# Patient Record
Sex: Male | Born: 1995 | Race: Black or African American | Hispanic: No | Marital: Single | Smoking: Never smoker
Health system: Southern US, Community
[De-identification: ages and names within clinical notes are randomized; demographics above are authoritative.]

## PROBLEM LIST (undated history)

## (undated) DIAGNOSIS — R569 Unspecified convulsions: Secondary | ICD-10-CM

---
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
## 2015-09-21 ENCOUNTER — Emergency Department
Admission: EM | Admit: 2015-09-21 | Discharge: 2015-09-21 | Disposition: A | Discharge status: Home / Self Care | Payer: Self-pay | Attending: Emergency Medicine | Admitting: Emergency Medicine

## 2015-09-21 DIAGNOSIS — R569 Unspecified convulsions: Secondary | ICD-10-CM | POA: Insufficient documentation

## 2015-09-21 DIAGNOSIS — X58XXXA Exposure to other specified factors, initial encounter: Secondary | ICD-10-CM | POA: Insufficient documentation

## 2015-09-21 DIAGNOSIS — S0990XA Unspecified injury of head, initial encounter: Secondary | ICD-10-CM | POA: Insufficient documentation

## 2015-09-21 DIAGNOSIS — Y9389 Activity, other specified: Secondary | ICD-10-CM | POA: Insufficient documentation

## 2015-09-21 DIAGNOSIS — Y998 Other external cause status: Secondary | ICD-10-CM | POA: Insufficient documentation

## 2015-09-21 DIAGNOSIS — Y9289 Other specified places as the place of occurrence of the external cause: Secondary | ICD-10-CM | POA: Insufficient documentation

## 2015-09-21 DIAGNOSIS — S00512A Abrasion of oral cavity, initial encounter: Secondary | ICD-10-CM | POA: Insufficient documentation

## 2015-09-21 HISTORY — DX: Unspecified convulsions: R56.9

## 2015-09-21 LAB — CBC WITH DIFFERENTIAL/PLATELET
BASOS PCT: 1 %
Basophils Absolute: 0 10*3/uL (ref 0–0.1)
EOS ABS: 0.1 10*3/uL (ref 0–0.7)
Eosinophils Relative: 1 %
HCT: 46.3 % (ref 40.0–52.0)
HEMOGLOBIN: 16.1 g/dL (ref 13.0–18.0)
Lymphocytes Relative: 22 %
Lymphs Abs: 1.2 10*3/uL (ref 1.0–3.6)
MCH: 30.5 pg (ref 26.0–34.0)
MCHC: 34.7 g/dL (ref 32.0–36.0)
MCV: 87.8 fL (ref 80.0–100.0)
MONOS PCT: 7 %
Monocytes Absolute: 0.3 10*3/uL (ref 0.2–1.0)
NEUTROS PCT: 69 %
Neutro Abs: 3.6 10*3/uL (ref 1.4–6.5)
Platelets: 156 10*3/uL (ref 150–440)
RBC: 5.27 MIL/uL (ref 4.40–5.90)
RDW: 14.1 % (ref 11.5–14.5)
WBC: 5.2 10*3/uL (ref 3.8–10.6)

## 2015-09-21 LAB — COMPREHENSIVE METABOLIC PANEL
ALBUMIN: 4.4 g/dL (ref 3.5–5.0)
ALK PHOS: 83 U/L (ref 38–126)
ALT: 15 U/L — ABNORMAL LOW (ref 17–63)
ANION GAP: 5 (ref 5–15)
AST: 20 U/L (ref 15–41)
BUN: 17 mg/dL (ref 6–20)
CO2: 23 mmol/L (ref 22–32)
Calcium: 9 mg/dL (ref 8.9–10.3)
Chloride: 111 mmol/L (ref 101–111)
Creatinine, Ser: 0.72 mg/dL (ref 0.61–1.24)
GFR calc Af Amer: >60 mL/min (ref >60)
GFR calc non Af Amer: >60 mL/min (ref >60)
GLUCOSE: 98 mg/dL (ref 65–99)
POTASSIUM: 3.9 mmol/L (ref 3.5–5.1)
SODIUM: 139 mmol/L (ref 135–145)
Total Bilirubin: 0.8 mg/dL (ref 0.3–1.2)
Total Protein: 7.3 g/dL (ref 6.5–8.1)

## 2015-09-21 LAB — URINE DRUG SCREEN, QUALITATIVE (ARMC ONLY)
AMPHETAMINES, UR SCREEN: NOT DETECTED
Barbiturates, Ur Screen: NOT DETECTED
Benzodiazepine, Ur Scrn: NOT DETECTED
Cannabinoid 50 Ng, Ur ~~LOC~~: NOT DETECTED
Cocaine Metabolite,Ur ~~LOC~~: NOT DETECTED
MDMA (ECSTASY) UR SCREEN: NOT DETECTED
Methadone Scn, Ur: NOT DETECTED
OPIATE, UR SCREEN: NOT DETECTED
PHENCYCLIDINE (PCP) UR S: NOT DETECTED
Tricyclic, Ur Screen: NOT DETECTED

## 2015-09-21 MED ORDER — IBUPROFEN 800 MG PO TABS
ORAL_TABLET | ORAL | Status: AC
  Administered 2015-09-21: 800 mg via ORAL
  Filled 2015-09-21: qty 1

## 2015-09-21 MED ORDER — IBUPROFEN 800 MG PO TABS
800.0000 mg | ORAL_TABLET | Freq: Once | ORAL | Status: AC
  Administered 2015-09-21: 800 mg via ORAL

## 2015-09-21 MED ORDER — LEVETIRACETAM 500 MG PO TABS: 500.0000 mg | ORAL_TABLET | Freq: Once | ORAL | Status: AC

## 2015-09-21 NOTE — ED Provider Notes (Signed)
[facility] Emergency Department Provider Note     Time seen: ----------------------------------------- 7:51 AM on 09/21/2015 -----------------------------------------    I have reviewed the triage vital signs and the nursing notes.   HISTORY  Chief Complaint Seizures    HPI Peter Stokes. is a 20 y.o. male brought to the ER after suspected seizure this morning. Patient is brought in with his mother, states she was woken by some noise in his room. She states he has not been acting himself, is his history of seizures and is on Keppra. Mom states he woke her up 30 minutes prior to arrival concerning have had a seizure complaint of headache and soreness to the left side of his tongue. The symptoms are consistent with prior seizure. Patient denies any complaints currently other than headache. They report patient has to get up very early in the morning to go to work, he's been working almost every day.   Past Medical History  Diagnosis Date  . Seizures (HCC)     There are no active problems to display for this patient.   History reviewed. No pertinent past surgical history.  Allergies Review of patient's allergies indicates no known allergies.  Social History Social History  Substance Use Topics  . Smoking status: Never Smoker   . Smokeless tobacco: None  . Alcohol Use: No    Review of Systems Constitutional: Negative for fever. Eyes: Negative for visual changes. ENT: Negative for sore throat. Cardiovascular: Negative for chest pain. Respiratory: Negative for shortness of breath. Gastrointestinal: Negative for abdominal pain, vomiting and diarrhea. Genitourinary: Negative for dysuria. Musculoskeletal: Negative for back pain. Skin: Negative for rash. Neurological: Positive for headache, possible seizure  10-point ROS otherwise negative.  ____________________________________________   PHYSICAL EXAM:  VITAL SIGNS: ED Triage Vitals   Enc Vitals Group     BP 09/21/15 0717 129/83 mmHg     Pulse Rate 09/21/15 0717 61     Resp 09/21/15 0717 18     Temp 09/21/15 0717 97.9 F (36.6 C)     Temp Source 09/21/15 0717 Oral     SpO2 09/21/15 0717 100 %     Weight 09/21/15 0717 150 lb (68.04 kg)     Height 09/21/15 0717  (1.702 m)     Head Cir --      Peak Flow --      Pain Score 09/21/15 0718 8     Pain Loc --      Pain Edu? --      Excl. in GC? --     Constitutional: Alert and oriented. Well appearing and in no distress. Eyes: Conjunctivae are normal. PERRL. Normal extraocular movements. ENT   Head: Normocephalic and atraumatic.   Nose: No congestion/rhinnorhea.   Mouth/Throat: Mucous membranes are moist. Abrasions noted to the left side of the tongue   Neck: No stridor. Cardiovascular: Normal rate, regular rhythm. Normal and symmetric distal pulses are present in all extremities. No murmurs, rubs, or gallops. Respiratory: Normal respiratory effort without tachypnea nor retractions. Breath sounds are clear and equal bilaterally. No wheezes/rales/rhonchi. Gastrointestinal: Soft and nontender. No distention. No abdominal bruits.  Musculoskeletal: Nontender with normal range of motion in all extremities. No joint effusions.  No lower extremity tenderness nor edema. Neurologic:  Normal speech and language. No gross focal neurologic deficits are appreciated. Speech is normal. No gait instability. Skin:  Skin is warm, dry and intact. No rash noted. Psychiatric: Mood and affect are normal. Speech and behavior  are normal. Patient exhibits appropriate insight and judgment. ____________________________________________  ED COURSE:  Pertinent labs & imaging results that were available during my care of the patient were reviewed by me and considered in my medical decision making (see chart for details). Patient is in no acute distress, likely breakthrough seizure. We'll obtain basic labs, likely increase his  Keppra dose. ____________________________________________    LABS (pertinent positives/negatives)  Labs Reviewed  COMPREHENSIVE METABOLIC PANEL - Abnormal; Notable for the following:    ALT 15 (*)    All other components within normal limits  CBC WITH DIFFERENTIAL/PLATELET  URINE DRUG SCREEN, QUALITATIVE ([facility] ONLY)   ____________________________________________  FINAL ASSESSMENT AND PLAN  Seizure  Plan: Patient with labs as dictated above. Patient is in no acute distress, is ambulatory and at his baseline neurologically. I will increase his Keppra from 2000 mg a day to 2500 mg. He'll take thousand milligrams in the morning and 1500 mg at night.   Emily Filbert, MD   Emily Filbert, MD 09/21/15 [phone]

## 2015-09-21 NOTE — Discharge Instructions (Signed)

## 2015-09-21 NOTE — ED Notes (Signed)
Pt here with his mother, she states he woke her up pta with c/o HA with possible seizure, states he has lac to tongue.. States he has not been acting himself.. States he has a hx of seizures and is on medication for them.

## 2016-04-11 ENCOUNTER — Emergency Department
Admission: EM | Admit: 2016-04-11 | Discharge: 2016-04-11 | Disposition: A | Discharge status: Home / Self Care | Payer: Self-pay | Attending: Emergency Medicine | Admitting: Emergency Medicine

## 2016-04-11 ENCOUNTER — Encounter: Payer: Self-pay | Admitting: Emergency Medicine

## 2016-04-11 DIAGNOSIS — L02411 Cutaneous abscess of right axilla: Secondary | ICD-10-CM | POA: Insufficient documentation

## 2016-04-11 DIAGNOSIS — L0291 Cutaneous abscess, unspecified: Secondary | ICD-10-CM

## 2016-04-11 MED ORDER — IBUPROFEN 800 MG PO TABS: 800.0000 mg | ORAL_TABLET | Freq: Three times a day (TID) | ORAL | 0 refills | Status: AC | PRN

## 2016-04-11 MED ORDER — LIDOCAINE-EPINEPHRINE (PF) 1 %-1:200000 IJ SOLN: 30.0000 mL | Freq: Once | INTRAMUSCULAR | Status: DC

## 2016-04-11 MED ORDER — LIDOCAINE-EPINEPHRINE (PF) 1 %-1:200000 IJ SOLN
INTRAMUSCULAR | Status: AC
  Administered 2016-04-11: 30 mL
  Filled 2016-04-11: qty 30

## 2016-04-11 MED ORDER — SULFAMETHOXAZOLE-TRIMETHOPRIM 800-160 MG PO TABS: 1.0000 | ORAL_TABLET | Freq: Two times a day (BID) | ORAL | 0 refills | Status: DC

## 2016-04-11 MED ORDER — HYDROCODONE-ACETAMINOPHEN 5-325 MG PO TABS
1.0000 | ORAL_TABLET | ORAL | Status: AC
  Administered 2016-04-11: 1 via ORAL

## 2016-04-11 NOTE — ED Provider Notes (Signed)
[facility]-EMERGENCY DEPARTMENT Provider Note   CSN: [id number] Arrival date & time: 04/11/16  1736     History   Chief Complaint Chief Complaint  Patient presents with  . Abscess    HPI Peter Stokes. is a 20 y.o. male presents to the emergency for evaluation of right axillary abscess. Abscess has been present for 4 days. No history of abscesses to the axillary regions. Pain is 10 out of 10. Is not a medications for pain. He denies any drainage or fevers. No numbness or tingling in the right upper extremity. No headaches or neck pain.  HPI  Past Medical History:  Diagnosis Date  . Seizures (HCC)     There are no active problems to display for this patient.   No past surgical history on file.     Home Medications    Prior to Admission medications   Medication Sig Start Date End Date Taking? Authorizing Provider  ibuprofen (ADVIL,MOTRIN) 800 MG tablet Take 1 tablet (800 mg total) by mouth every 8 (eight) hours as needed. 04/11/16   Evon Slack, PA-C  levETIRAcetam (KEPPRA) 500 MG tablet Take 1 tablet (500 mg total) by mouth once. Take one tablet with your 1000 mg tablets at night for seizure prevention.   You will be taking 1000 mg in the morning and 1500 mg at night 09/21/15   Emily Filbert, MD  sulfamethoxazole-trimethoprim (BACTRIM DS,SEPTRA DS) 800-160 MG tablet Take 1 tablet by mouth 2 (two) times daily. 04/11/16   Evon Slack, PA-C    Family History No family history on file.  Social History Social History  Substance Use Topics  . Smoking status: Never Smoker  . Smokeless tobacco: Not on file  . Alcohol use No     Allergies   Review of patient's allergies indicates no known allergies.   Review of Systems Review of Systems  Constitutional: Negative for chills and fever.  HENT: Negative for ear pain and sore throat.   Eyes: Negative for pain and visual disturbance.  Respiratory: Negative for cough and shortness of breath.     Cardiovascular: Negative for chest pain and palpitations.  Gastrointestinal: Negative for abdominal pain and vomiting.  Genitourinary: Negative for dysuria and hematuria.  Musculoskeletal: Negative for arthralgias and back pain.  Skin: Positive for wound (Right axillary abscess). Negative for color change and rash.  Neurological: Negative for seizures and syncope.  All other systems reviewed and are negative.    Physical Exam Updated Vital Signs BP (!) 142/85 (BP Location: Left Arm)   Pulse 74   Temp 98.1 F (36.7 C) (Oral)   Resp 18   Ht 5\' 4"  (1.626 m)   Wt 68 kg   SpO2 100%   BMI 25.75 kg/m   Physical Exam  Constitutional: He appears well-developed and well-nourished.  HENT:  Head: Normocephalic and atraumatic.  Eyes: Conjunctivae are normal.  Neck: Neck supple.  Cardiovascular: Normal rate and regular rhythm.   No murmur heard. Pulmonary/Chest: Effort normal and breath sounds normal. No respiratory distress.  Abdominal: Soft. There is no tenderness.  Musculoskeletal: Normal range of motion. He exhibits no edema.  Neurological: He is alert.  Skin: Skin is warm and dry.  Right axillary abscess 3 cm in diameter, fluctuant and tender to palpation. No active drainage. Patient will full range of motion right upper extremity.  Psychiatric: He has a normal mood and affect.  Nursing note and vitals reviewed.    ED Treatments / Results  Labs (  all labs ordered are listed, but only abnormal results are displayed) Labs Reviewed - No data to display  EKG  EKG Interpretation None       Radiology No results found.  Procedures Procedures (including critical care time) INCISION AND DRAINAGE Performed by: Patience MuscaGAINES, Woodward Klem CHRISTOPHER Consent: Verbal consent obtained. Risks and benefits: risks, benefits and alternatives were discussed Type: abscess  Body area: Right axillary  Anesthesia: local infiltration  Incision was made with a scalpel.  Local anesthetic:  lidocaine 1 % with epinephrine  Anesthetic total: 3 ml  Complexity: complex Blunt dissection to break up loculations  Drainage: purulent  Drainage amount: 5 cc   Packing material: 1/4 in iodoform gauze  Patient tolerance: Patient tolerated the procedure well with no immediate complications.    Medications Ordered in ED Medications  lidocaine-EPINEPHrine (XYLOCAINE W/EPI) 1 %-1:100000 (with pres) injection 30 mL (not administered)  HYDROcodone-acetaminophen (NORCO/VICODIN) 5-325 MG per tablet 1 tablet (1 tablet Oral Given 04/11/16 1836)  lidocaine-EPINEPHrine (XYLOCAINE-EPINEPHrine) 1 %-1:200000 (PF) injection (30 mLs  Given 04/11/16 1837)     Initial Impression / Assessment and Plan / ED Course  I have reviewed the triage vital signs and the nursing notes.  Pertinent labs & imaging results that were available during my care of the patient were reviewed by me and considered in my medical decision making (see chart for details).  Clinical Course    20 year old male with right axillary abscess. Incision and drainage was performed. Abscess was packed with iodoform packing. He is placed on Bactrim DS. He'll follow-up with PCP or emergency pertinent to 3 days for wound check and packing removal. He is educated on signs and symptoms return or to pertinent for.  Final Clinical Impressions(s) / ED Diagnoses   Final diagnoses:  Abscess  Abscess of axilla, right    New Prescriptions New Prescriptions   IBUPROFEN (ADVIL,MOTRIN) 800 MG TABLET    Take 1 tablet (800 mg total) by mouth every 8 (eight) hours as needed.   SULFAMETHOXAZOLE-TRIMETHOPRIM (BACTRIM DS,SEPTRA DS) 800-160 MG TABLET    Take 1 tablet by mouth 2 (two) times daily.     Evon Slackhomas C Shariyah Eland, PA-C 04/11/16 1907    Arnaldo NatalPaul F Malinda, MD 04/20/16 1311

## 2016-04-11 NOTE — ED Notes (Signed)
States he developed a possible abscess area under right arm about 3-4 days ago   Area has gotten larger and increased pain

## 2016-04-11 NOTE — ED Notes (Signed)
Reviewed d/c instructions, follow-up care and prescriptions with pt. Pt verbalized understanding 

## 2016-04-11 NOTE — Discharge Instructions (Signed)
Please follow-up with PCP or emergency department to 3 days for wound check, packing removal. Return to the ER medially for any fevers severe pain worsening symptoms urgent changes in her health.

## 2016-04-11 NOTE — ED Triage Notes (Signed)
Pt with abscess to right armpit x4 days.

## 2016-04-13 ENCOUNTER — Encounter: Payer: Self-pay | Admitting: Emergency Medicine

## 2016-04-13 ENCOUNTER — Emergency Department
Admission: EM | Admit: 2016-04-13 | Discharge: 2016-04-13 | Disposition: A | Discharge status: Home / Self Care | Payer: Self-pay | Attending: Emergency Medicine | Admitting: Emergency Medicine

## 2016-04-13 DIAGNOSIS — Z4801 Encounter for change or removal of surgical wound dressing: Secondary | ICD-10-CM | POA: Insufficient documentation

## 2016-04-13 DIAGNOSIS — Z09 Encounter for follow-up examination after completed treatment for conditions other than malignant neoplasm: Secondary | ICD-10-CM

## 2016-04-13 NOTE — ED Triage Notes (Signed)
Wound check for abscess in right axilla

## 2016-04-13 NOTE — Discharge Instructions (Signed)
Finish all antibiotics as previously prescribed.   Keep wound covered until fully closed.

## 2016-04-13 NOTE — ED Provider Notes (Signed)
[facility] Emergency Department Provider Note  ____________________________________________  Time seen: Approximately 10:52 AM  I have reviewed the triage vital signs and the nursing notes.   HISTORY  Chief Complaint Wound Check    HPI Peter Stokes. is a 20 y.o. male , NAD, presents to the emergency department accompanied by his mother, for recheck of right axilla after incision and drainage. Patient was seen in this emergency department 2 days ago for incision and drainage of abscess about the right axilla. Packing was placed and gauze covering. Patient states he has been tolerating his medications without side effects or difficulty. Denies any fevers, chills, body aches. Has had full range of motion of the right upper extremity without pain or difficulty. Has numbness, weakness, tingling. Has had no chest pain, shortness breath, abdominal pain, nausea, vomiting, diarrhea. Has not noted any excessive drainage from the area since the incision and drainage 2 days ago. Has no other complaints to be evaluated today.   Past Medical History:  Diagnosis Date  . Seizures (HCC)     There are no active problems to display for this patient.   History reviewed. No pertinent surgical history.  Prior to Admission medications   Medication Sig Start Date End Date Taking? Authorizing Provider  ibuprofen (ADVIL,MOTRIN) 800 MG tablet Take 1 tablet (800 mg total) by mouth every 8 (eight) hours as needed. 04/11/16   Evon Slack, PA-C  levETIRAcetam (KEPPRA) 500 MG tablet Take 1 tablet (500 mg total) by mouth once. Take one tablet with your 1000 mg tablets at night for seizure prevention.   You will be taking 1000 mg in the morning and 1500 mg at night 09/21/15   Emily Filbert, MD  sulfamethoxazole-trimethoprim (BACTRIM DS,SEPTRA DS) 800-160 MG tablet Take 1 tablet by mouth 2 (two) times daily. 04/11/16   Evon Slack, PA-C    Allergies Review of patient's  allergies indicates no known allergies.  No family history on file.  Social History Social History  Substance Use Topics  . Smoking status: Never Smoker  . Smokeless tobacco: Never Used  . Alcohol use No     Review of Systems  Constitutional: No fever/chills Cardiovascular: No chest pain. Respiratory: No shortness of breath. No wheezing.  Gastrointestinal: No abdominal pain.  No nausea, vomiting.  No diarrhea. Musculoskeletal: Negative for General myalgias or right upper extremity pain.  Skin: Positive incisional wound right axilla. Negative for rash, redness, swelling. Neurological: Negative for numbness, weakness, tingling.Marland Kitchen 10-point ROS otherwise negative.  ____________________________________________   PHYSICAL EXAM:  VITAL SIGNS: ED Triage Vitals  Enc Vitals Group     BP 04/13/16 1018 137/71     Pulse Rate 04/13/16 1018 89     Resp 04/13/16 1018 18     Temp 04/13/16 1018 97.9 F (36.6 C)     Temp Source 04/13/16 1018 Oral     SpO2 04/13/16 1018 98 %     Weight 04/13/16 1019 150 lb (68 kg)     Height 04/13/16 1019 5\' 6"  (1.676 m)     Head Circumference --      Peak Flow --      Pain Score --      Pain Loc --      Pain Edu? --      Excl. in GC? --      Constitutional: Alert and oriented. Well appearing and in no acute distress. Eyes: Conjunctivae are normal. Head: Atraumatic. Hematological/Lymphatic/Immunilogical: No Axillary lymphadenopathy. Cardiovascular:  Good peripheral circulation. Respiratory: Normal respiratory effort without tachypnea or retractions.  Musculoskeletal: Full range of motion of the right upper extremity without difficulty or pain. Neurologic:  Normal speech and language. No gross focal neurologic deficits are appreciated.  Skin:  Incisional wound approximately 0.5cm noted about the right axilla with packing in place. Packing was removed without difficulty or pain. No active oozing or weeping. Area is without induration and has trace  tenderness to deep palpation. Wound and cavity are clean with good granulation tissue. Skin is warm, dry. No rash noted. Psychiatric: Mood and affect are normal. Speech and behavior are normal. Patient exhibits appropriate insight and judgement.   ____________________________________________   LABS  None ____________________________________________  EKG  None ____________________________________________  RADIOLOGY  None ____________________________________________    PROCEDURES  Procedure(s) performed: None   Procedures   Medications - No data to display   ____________________________________________   INITIAL IMPRESSION / ASSESSMENT AND PLAN / ED COURSE  Pertinent labs & imaging results that were available during my care of the patient were reviewed by me and considered in my medical decision making (see chart for details).  Clinical Course    Patient's diagnosis is consistent with Encounter for recheck of abscess following incision and drainage. Patient will be discharged home with instructions to continue all antibiotics until completion. Patient is to keep the wound clean and dry until fully healed and closed then may continue with regular cleansing routine. [facility]. Patient is given ED precautions to return to the ED for any worsening or new symptoms.    ____________________________________________  FINAL CLINICAL IMPRESSION(S) / ED DIAGNOSES  Final diagnoses:  Encounter for recheck of abscess following incision and drainage      NEW MEDICATIONS STARTED DURING THIS VISIT:  New Prescriptions   No medications on file         Hope PigeonJami L Shelita Steptoe, PA-C 04/13/16 1104    Jeanmarie PlantJames A McShane, MD 04/13/16 1459

## 2016-09-11 ENCOUNTER — Emergency Department: Payer: Self-pay

## 2016-09-11 ENCOUNTER — Emergency Department
Admission: EM | Admit: 2016-09-11 | Discharge: 2016-09-11 | Disposition: A | Discharge status: Home / Self Care | Payer: Self-pay | Attending: Emergency Medicine | Admitting: Emergency Medicine

## 2016-09-11 ENCOUNTER — Encounter: Payer: Self-pay | Admitting: Emergency Medicine

## 2016-09-11 DIAGNOSIS — R002 Palpitations: Secondary | ICD-10-CM | POA: Insufficient documentation

## 2016-09-11 DIAGNOSIS — R519 Headache, unspecified: Secondary | ICD-10-CM

## 2016-09-11 DIAGNOSIS — G40909 Epilepsy, unspecified, not intractable, without status epilepticus: Secondary | ICD-10-CM | POA: Insufficient documentation

## 2016-09-11 DIAGNOSIS — R51 Headache: Secondary | ICD-10-CM

## 2016-09-11 LAB — CBC WITH DIFFERENTIAL/PLATELET
BASOS ABS: 0 10*3/uL (ref 0–0.1)
BASOS PCT: 1 %
Eosinophils Absolute: 0.1 10*3/uL (ref 0–0.7)
Eosinophils Relative: 1 %
HEMATOCRIT: 50.5 % (ref 40.0–52.0)
HEMOGLOBIN: 17 g/dL (ref 13.0–18.0)
Lymphocytes Relative: 27 %
Lymphs Abs: 1.6 10*3/uL (ref 1.0–3.6)
MCH: 30.3 pg (ref 26.0–34.0)
MCHC: 33.7 g/dL (ref 32.0–36.0)
MCV: 89.8 fL (ref 80.0–100.0)
MONOS PCT: 8 %
Monocytes Absolute: 0.5 10*3/uL (ref 0.2–1.0)
NEUTROS ABS: 3.8 10*3/uL (ref 1.4–6.5)
NEUTROS PCT: 63 %
Platelets: 154 10*3/uL (ref 150–440)
RBC: 5.62 MIL/uL (ref 4.40–5.90)
RDW: 13.8 % (ref 11.5–14.5)
WBC: 5.9 10*3/uL (ref 3.8–10.6)

## 2016-09-11 LAB — COMPREHENSIVE METABOLIC PANEL
ALBUMIN: 4.6 g/dL (ref 3.5–5.0)
ALT: 15 U/L — ABNORMAL LOW (ref 17–63)
ANION GAP: 8 (ref 5–15)
AST: 22 U/L (ref 15–41)
Alkaline Phosphatase: 68 U/L (ref 38–126)
BILIRUBIN TOTAL: 0.8 mg/dL (ref 0.3–1.2)
BUN: 17 mg/dL (ref 6–20)
CHLORIDE: 105 mmol/L (ref 101–111)
CO2: 24 mmol/L (ref 22–32)
Calcium: 9.5 mg/dL (ref 8.9–10.3)
Creatinine, Ser: 0.92 mg/dL (ref 0.61–1.24)
GFR calc Af Amer: >60 mL/min (ref >60)
GFR calc non Af Amer: >60 mL/min (ref >60)
GLUCOSE: 97 mg/dL (ref 65–99)
POTASSIUM: 3.8 mmol/L (ref 3.5–5.1)
Sodium: 137 mmol/L (ref 135–145)
TOTAL PROTEIN: 7.7 g/dL (ref 6.5–8.1)

## 2016-09-11 LAB — TROPONIN I

## 2016-09-11 NOTE — ED Triage Notes (Signed)
Pt presents with headache and feels like he might have a seizure. Pt with hx of same and takes seizure meds.

## 2016-09-11 NOTE — Discharge Instructions (Signed)
[facility] Please make she continue taking her Keppra. Please call Dr. ENT the cardiologist on call and set up a follow-up appointment to evaluate your palpitations that she were having. At the present time everything we've done and looks normal. Please be sure to return for any further problems.

## 2016-09-11 NOTE — ED Notes (Signed)
Pt reports to EDP that his heart "was beating real fast" this morning and that he felt numb in his left side. He reports that his left leg feels weak. No deficits noted in NIH scale. Pt reported that his father passed away at 4648 with heart issues (a-fib).

## 2016-09-11 NOTE — ED Provider Notes (Signed)
[facility] Emergency Department Provider Note   ____________________________________________   First MD Initiated Contact with Patient 09/11/16 [phone]     (approximate)  I have reviewed the triage vital signs and the nursing notes.   HISTORY  Chief Complaint Seizures and Headache   HPI Lysbeth Pennerdam D Szafranski Jr. is a 21 y.o. male Patient repo it got worse at got worse and worse over the course of the morning to frontal and occipital squeezing and tight bu also says it throbs reports he had some chest pain last night and his heart was beating very fast and short of breath when the heart was beating fast he thinks it lasted less than 5 minutes.Patient says his father and one of his other male relatives died in their late 5040s of heart trouble w Atrial fibrillation.patient reports his left leg feels a little weak. He says he always gets weak in his left leg and on his left side after she had a seizure. He's had this happen several times in the past. He sees [facility]> Patient also   Past Medical History:  Diagnosis Date  . Seizures (HCC)     There are no active problems to display for this patient.   History reviewed. No pertinent surgical history.  Prior to Admission medications   Medication Sig Start Date End Date Taking? Authorizing Provider  ibuprofen (ADVIL,MOTRIN) 800 MG tablet Take 1 tablet (800 mg total) by mouth every 8 (eight) hours as needed. 04/11/16  Yes Evon Slackhomas C Gaines, PA-C  levETIRAcetam (KEPPRA) 500 MG tablet Take 1 tablet (500 mg total) by mouth once. Take one tablet with your 1000 mg tablets at night for seizure prevention.   You will be taking 1000 mg in the morning and 1500 mg at night Patient taking differently: Take 500 mg by mouth once. Pt takes 1000 mg at 7am and 7pm. Pt also take an additional 500 mg at 7pm (1500 mg) 09/21/15  Yes Emily FilbertJonathan E Williams, MD    Allergies Patient has no known allergies.  No family history on file.  Social  History Social History  Substance Use Topics  . Smoking status: Never Smoker  . Smokeless tobacco: Never Used  . Alcohol use No    Review of Systems Constitutional: No fever/chills Eyes: No visual changes. ENT: No sore throat. Cardiovascular: Denies chest pain. Respiratory: Denies shortness of breath. Gastrointestinal: No abdominal pain.  No nausea, no vomiting.  No diarrhea.  No constipation. Genitourinary: Negative for dysuria. Musculoskeletal: Negative for back pain. Skin: Negative for rash. Neurological:see history of present illness 10-point ROS otherwise negative.  ____________________________________________   PHYSICAL EXAM:  VITAL SIGNS: ED Triage Vitals  Enc Vitals Group     BP 09/11/16 0746 131/85     Pulse Rate 09/11/16 0746 68     Resp 09/11/16 0746 18     Temp 09/11/16 0746 98 F (36.7 C)     Temp Source 09/11/16 0746 Oral     SpO2 09/11/16 0746 99 %     Weight 09/11/16 0747 165 lb (74.8 kg)     Height 09/11/16 0747 5\' 8"  (1.727 m)     Head Circumference --      Peak Flow --      Pain Score 09/11/16 0747 8     Pain Loc --      Pain Edu? --      Excl. in GC? --     Constitutional: Alert and oriented. Well appearing and in no acute  distress. Eyes: Conjunctivae are normal. PERRL. EOMI.unduscopic exam is normal Head: Atraumatic. Nose: No congestion/rhinnorhea. Mouth/Throat: Mucous membranes are moist.  Oropharynx non-erythematous. Neck: No stridor.  Cardiovascular: Normal rate, regular rhythm. Grossly normal heart sounds.  Good peripheral circulation. Respiratory: Normal respiratory effort.  No retractions. Lungs CTAB. Gastrointestinal: Soft and nontender. No distention. No abdominal bruits. No CVA tenderness. Musculoskeletal: No lower extremity tenderness nor edema.  No joint effusions. Neurologic:  Normal speech and language. No gross focal neurologic deficits are appreciated.cranial nerves II through XII are intact including visual fields cerebellar  finger-nose is normal motor strength appears to be 5 over 5 throughout. Patient does say his left leg feels somewhat weaker and not sure that I can elicit that. No gait instability. Skin:  Skin is warm, dry and intact. No rash noted. Psychiatric: Mood and affect are normal. Speech and behavior are normal.  ____________________________________________   LABS (all labs ordered are listed, but only abnormal results are displayed)  Labs Reviewed  COMPREHENSIVE METABOLIC PANEL - Abnormal; Notable for the following:       Result Value   ALT 15 (*)    All other components within normal limits  TROPONIN I  CBC WITH DIFFERENTIAL/PLATELET  LEVETIRACETAM LEVEL   ____________________________________________  EKG  EKG read and interpreted by me shows normal sinus rhythm rate of 66 normal axis essentially normal EKG ____________________________________________  RADIOLOGY  Study Result   CLINICAL DATA:  21 year old male with left-sided chest pain and numbness with headache. Nonsmoker. Initial encounter.  EXAM: PORTABLE CHEST 1 VIEW  COMPARISON:  None.  FINDINGS: No infiltrate, congestive heart failure or pneumothorax.  Mild scoliosis thoracic spine.  Heart size within normal limits.  IMPRESSION: No active disease.   Electronically Signed   By: Lacy Duverney M.D.   On: 09/11/2016 08:36   Study Result   CLINICAL DATA:  Headaches for several hours  EXAM: CT HEAD WITHOUT CONTRAST  TECHNIQUE: Contiguous axial images were obtained from the base of the skull through the vertex without intravenous contrast.  COMPARISON:  None.  FINDINGS: Brain: No evidence of acute infarction, hemorrhage, hydrocephalus, extra-axial collection or mass lesion/mass effect.  Vascular: No hyperdense vessel or unexpected calcification.  Skull: Normal. Negative for fracture or focal lesion.  Sinuses/Orbits: No acute finding.  Other: None.  IMPRESSION: No acute  abnormality noted   Electronically Signed   By: Alcide Clever M.D.   On: 09/11/2016 10:30     ____________________________________________   PROCEDURES  Procedure(s) performed:  Procedures  Critical Care performed:  ____________________________________________   INITIAL IMPRESSION / ASSESSMENT AND PLAN / ED COURSE  Pertinent labs & imaging results that were available during my care of the patient were reviewed by me and considered in my medical decision making (see chart for details).        ____________________________________________   FINAL CLINICAL IMPRESSION(S) / ED DIAGNOSES  Final diagnoses:  Nonintractable episodic headache, unspecified headache type  Seizure disorder (HCC)  Palpitations      NEW MEDICATIONS STARTED DURING THIS VISIT:  New Prescriptions   No medications on file     Note:  This document was prepared using Dragon voice recognition software and may include unintentional dictation errors.    Arnaldo Natal, MD 09/11/16 1113

## 2016-09-11 NOTE — ED Notes (Signed)
Pt presents with headache since 4am; states he went back to sleep and awakened with it worse. Pt also reports chest pain last night that was still present when he went to sleep. It has since resolved. Pt states pain is in the front and back of his head, squeezing in nature.

## 2016-09-13 LAB — LEVETIRACETAM LEVEL: LEVETIRACETAM: 28.2 ug/mL (ref 10.0–40.0)

## 2016-11-15 ENCOUNTER — Emergency Department
Admission: EM | Admit: 2016-11-15 | Discharge: 2016-11-15 | Disposition: A | Discharge status: Home / Self Care | Payer: Self-pay | Attending: Emergency Medicine | Admitting: Emergency Medicine

## 2016-11-15 DIAGNOSIS — Z79899 Other long term (current) drug therapy: Secondary | ICD-10-CM | POA: Insufficient documentation

## 2016-11-15 DIAGNOSIS — K0889 Other specified disorders of teeth and supporting structures: Secondary | ICD-10-CM

## 2016-11-15 DIAGNOSIS — K0510 Chronic gingivitis, plaque induced: Secondary | ICD-10-CM | POA: Insufficient documentation

## 2016-11-15 DIAGNOSIS — Z791 Long term (current) use of non-steroidal anti-inflammatories (NSAID): Secondary | ICD-10-CM | POA: Insufficient documentation

## 2016-11-15 MED ORDER — PENICILLIN V POTASSIUM 500 MG PO TABS: 500.0000 mg | ORAL_TABLET | Freq: Three times a day (TID) | ORAL | 0 refills | Status: AC

## 2016-11-15 MED ORDER — PENICILLIN V POTASSIUM 500 MG PO TABS
500.0000 mg | ORAL_TABLET | Freq: Once | ORAL | Status: AC
  Administered 2016-11-15: 500 mg via ORAL
  Filled 2016-11-15: qty 1

## 2016-11-15 NOTE — ED Provider Notes (Signed)
[facility] Emergency Department Provider Note ____________________________________________  Time seen: 1000  I have reviewed the triage vital signs and the nursing notes.  HISTORY  Chief Complaint  Dental Pain   HPI Peter Stokes. is a 21 y.o. male presents to the ED for evaluation of left upper tooth pain for the last 2 days. He denies any fevers, chills, or sweats. He is unsure of what tooth, exactly is causing his pain. He denies any gum or facial swelling, pointing abscess, or purulent drainage.   Past Medical History:  Diagnosis Date  . Seizures (HCC)     There are no active problems to display for this patient.   History reviewed. No pertinent surgical history.  Prior to Admission medications   Medication Sig Start Date End Date Taking? Authorizing Provider  ibuprofen (ADVIL,MOTRIN) 800 MG tablet Take 1 tablet (800 mg total) by mouth every 8 (eight) hours as needed. 04/11/16   Evon Slack, PA-C  levETIRAcetam (KEPPRA) 500 MG tablet Take 1 tablet (500 mg total) by mouth once. Take one tablet with your 1000 mg tablets at night for seizure prevention.   You will be taking 1000 mg in the morning and 1500 mg at night Patient taking differently: Take 500 mg by mouth once. Pt takes 1000 mg at 7am and 7pm. Pt also take an additional 500 mg at 7pm (1500 mg) 09/21/15   Emily Filbert, MD  penicillin v potassium (VEETID) 500 MG tablet Take 1 tablet (500 mg total) by mouth 3 (three) times daily. 11/15/16   Charlesetta Ivory Jari Dipasquale, PA-C    Allergies Patient has no known allergies.  No family history on file.  Social History Social History  Substance Use Topics  . Smoking status: Never Smoker  . Smokeless tobacco: Never Used  . Alcohol use No    Review of Systems  Constitutional: Negative for fever. Eyes: Negative for visual changes. ENT: Negative for sore throat. Pain to the upper jaw on the left.  Cardiovascular: Negative for chest  pain. Respiratory: Negative for shortness of breath. Gastrointestinal: Negative for abdominal pain, vomiting and diarrhea. Skin: Negative for rash. Neurological: Negative for headaches, focal weakness or numbness. ____________________________________________  PHYSICAL EXAM:  VITAL SIGNS: ED Triage Vitals  Enc Vitals Group     BP 11/15/16 0954 137/63     Pulse Rate 11/15/16 0954 64     Resp 11/15/16 0954 17     Temp 11/15/16 0954 97.9 F (36.6 C)     Temp Source 11/15/16 0954 Oral     SpO2 11/15/16 0954 100 %     Weight 11/15/16 0950 175 lb (79.4 kg)     Height 11/15/16 0950  (1.702 m)     Head Circumference --      Peak Flow --      Pain Score 11/15/16 0950 8     Pain Loc --      Pain Edu? --      Excl. in GC? --     Constitutional: Alert and oriented. Well appearing and in no distress. Head: Normocephalic and atraumatic. Eyes: Conjunctivae are normal. PERRL. Normal extraocular movements Ears: Canals clear. TMs intact bilaterally. Nose: No congestion/rhinorrhea/epistaxis. Mouth/Throat: Mucous membranes are moist. Uvula is midline tonsils are flat. No oropharyngeal lesions are appreciated. Patient without any focal dental abscess, gumline swelling, facial swelling, or pointing pus collection. He localizes pain to an area of the buccal mucosa above his premolar of the left upper jaw.  The area is slightly erythematous but no frank abscess, ulceration, or lesion is noted. She does have some moderate generalized gingivitis, with some moderate plaque buildup at the gumline. No sublingual brawny erythema is noted. Neck: Supple. No thyromegaly. Hematological/Lymphatic/Immunological: No cervical lymphadenopathy. Cardiovascular: Normal rate, regular rhythm. Normal distal pulses. Respiratory: Normal respiratory effort. No wheezes/rales/rhonchi. Neurologic:  Normal gait without ataxia. Normal speech and language. No gross focal neurologic deficits are appreciated. Skin:  Skin is  warm, dry and intact. No rash noted. Psychiatric: Mood and affect are normal. Patient exhibits appropriate insight and judgment. ____________________________________________  PROCEDURES  Penicillin VK 500 mg PO ____________________________________________  INITIAL IMPRESSION / ASSESSMENT AND PLAN / ED COURSE  Patient with dental pain without obvious abscess, cavity, dental fracture. He also has some moderate gingivitis with dental plaque buildup. He is discharged with a prescription for penicillin the dose as directed. He is further advised to follow-up with a local dump provider on the list given, for routine, deep cleaning. He will otherwise see his provider [facility] for routine medical care. ____________________________________________  FINAL CLINICAL IMPRESSION(S) / ED DIAGNOSES  Final diagnoses:  Pain, dental  Gingivitis due to dental plaque      Lissa Hoard, PA-C 11/15/16 1650    Emily Filbert, MD 11/16/16 [phone]

## 2016-11-15 NOTE — Discharge Instructions (Signed)
You are being started on an antibiotic for a possible dental infection. Your pain may also be coming from some irritation in your gums. You should take the antibiotic until all pills are gone. You should buy a new, soft toothbrush. Brush every morning and night. Rinse with warm-salty water after every meal. See one of the dental clinics for a general cleaning.

## 2016-11-15 NOTE — ED Triage Notes (Signed)
Pt c/o left lower tooth pain for the past 2 days.

## 2016-11-22 ENCOUNTER — Emergency Department
Admission: EM | Admit: 2016-11-22 | Discharge: 2016-11-22 | Disposition: A | Discharge status: Home / Self Care | Payer: Self-pay | Attending: Emergency Medicine | Admitting: Emergency Medicine

## 2016-11-22 DIAGNOSIS — G40909 Epilepsy, unspecified, not intractable, without status epilepticus: Secondary | ICD-10-CM | POA: Insufficient documentation

## 2016-11-22 DIAGNOSIS — R569 Unspecified convulsions: Secondary | ICD-10-CM

## 2016-11-22 NOTE — ED Provider Notes (Signed)
[facility] Emergency Department Provider Note   ____________________________________________   First MD Initiated Contact with Patient 11/22/16 1028     (approximate)  I have reviewed the triage vital signs and the nursing notes.   HISTORY  Chief Complaint Seizures    HPI  Peter Stokes. is a 21 y.o. 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 He says that he was at work when he started having a headache and back pain. He left the machine which she was attending to and went to the bathroom. He then sees. It was unwitnessed. Unclear how long the seizure lasted. This point he is describing mild and diffuse headache as well as low back pain. He says that these symptoms are typical after he seizes. He says his last seizure was 2 years ago that he takes 1000 mg of Keppra in the morning and then 1500 mg of Keppra in the evenings. He denies feeling ill lately. Denies any recent illnesses. Denies any drug use or drinking. Has a neurologist at [facility]. He does not drive.   Past Medical History:  Diagnosis Date  . Seizures (HCC)     There are no active problems to display for this patient.   No past surgical history on file.  Prior to Admission medications   Medication Sig Start Date End Date Taking? Authorizing Provider  ibuprofen (ADVIL,MOTRIN) 800 MG tablet Take 1 tablet (800 mg total) by mouth every 8 (eight) hours as needed. 04/11/16   Evon Slack, PA-C  levETIRAcetam (KEPPRA) 500 MG tablet Take 1 tablet (500 mg total) by mouth once. Take one tablet with your 1000 mg tablets at night for seizure prevention.   You will be taking 1000 mg in the morning and 1500 mg at night Patient taking differently: Take 500 mg by mouth once. Pt takes 1000 mg at 7am and 7pm. Pt also take an additional 500 mg at 7pm (1500 mg) 09/21/15   Emily Filbert, MD  penicillin v potassium (VEETID) 500 MG tablet Take  1 tablet (500 mg total) by mouth 3 (three) times daily. 11/15/16   Charlesetta Ivory Menshew, PA-C    Allergies Patient has no known allergies.  No family history on file.  Social History Social History  Substance Use Topics  . Smoking status: Never Smoker  . Smokeless tobacco: Never Used  . Alcohol use No    Review of Systems  Constitutional: No fever/chills Eyes: No visual changes. ENT: No sore throat. Cardiovascular: Denies chest pain. Respiratory: Denies shortness of breath. Gastrointestinal: No abdominal pain.  No nausea, no vomiting.  No diarrhea.  No constipation. Genitourinary: Negative for dysuria. Musculoskeletal: Negative for back pain. Skin: Negative for rash. Neurological: Negative for headaches, focal weakness or numbness.   ____________________________________________   PHYSICAL EXAM:  VITAL SIGNS: ED Triage Vitals  Enc Vitals Group     BP 11/22/16 1025 (!) 141/72     Pulse Rate 11/22/16 1025 68     Resp 11/22/16 1025 18     Temp 11/22/16 1025 98.3 F (36.8 C)     Temp Source 11/22/16 1025 Oral     SpO2 11/22/16 1025 98 %     Weight 11/22/16 1027 175 lb (79.4 kg)     Height 11/22/16 1027  (1.702 m)     Head Circumference --      Peak Flow --  Pain Score 11/22/16 1145 8     Pain Loc --      Pain Edu? --      Excl. in GC? --     Constitutional: Alert and oriented. Well appearing and in no acute distress. Eyes: Conjunctivae are normal. PERRL. EOMI. Head: Atraumatic. Nose: No congestion/rhinnorhea. Mouth/Throat: Mucous membranes are moist.   Neck: No stridor.   Cardiovascular: Normal rate, regular rhythm. Grossly normal heart sounds.   Respiratory: Normal respiratory effort.  No retractions. Lungs CTAB. Gastrointestinal: Soft and nontender. No distention.  Musculoskeletal: No lower extremity tenderness nor edema.  No joint effusions.  No ttp to the back.  No deformity or step off.  Neurologic:  Normal speech and language. No gross focal  neurologic deficits are appreciated.  Skin:  Skin is warm, dry and intact. No rash noted. Psychiatric: Mood and affect are normal. Speech and behavior are normal.  ____________________________________________   LABS (all labs ordered are listed, but only abnormal results are displayed)  Labs Reviewed - No data to display ____________________________________________  EKG   ____________________________________________  RADIOLOGY   ____________________________________________   PROCEDURES  Procedure(s) performed:   Procedures  Critical Care performed:   ____________________________________________   INITIAL IMPRESSION / ASSESSMENT AND PLAN / ED COURSE  Pertinent labs & imaging results that were available during my care of the patient were reviewed by me and considered in my medical decision making (see chart for details).  ----------------------------------------- 12:27 PM on 11/22/2016 -----------------------------------------  Patient with likely breakthrough seizure. I will not be changing his medications at this time. He'll be following up with his neurologist at [facility]. We discussed him staying safe at work and discussing with his supervisors what will be safe, machine [employer], for him to operate. He will be discharged to home. He is back to baseline mental status and without any complaints at this time. His mother is at his bedside and also agrees that he is back to his baseline mental status      ____________________________________________   FINAL CLINICAL IMPRESSION(S) / ED DIAGNOSES  Final diagnoses:  Seizure (HCC)      NEW MEDICATIONS STARTED DURING THIS VISIT:  New Prescriptions   No medications on file     Note:  This document was prepared using Dragon voice recognition software and may include unintentional dictation errors.    Myrna Blazer, MD 11/22/16 1228

## 2016-11-22 NOTE — ED Triage Notes (Signed)
Pt presents to ED via [facility] c/o unwitnessed seizure at work. Denies biting tongue or incontinence. C/o back pain and headache, which pt states it typical for him. Last seizure approx. 2 years ago, dosage of keppra changed 2 mos ago.

## 2016-11-22 NOTE — ED Notes (Signed)

## 2017-01-23 ENCOUNTER — Encounter: Payer: Self-pay | Admitting: Emergency Medicine

## 2017-01-23 ENCOUNTER — Emergency Department
Admission: EM | Admit: 2017-01-23 | Discharge: 2017-01-23 | Disposition: A | Discharge status: Home / Self Care | Payer: Self-pay | Attending: Emergency Medicine | Admitting: Emergency Medicine

## 2017-01-23 DIAGNOSIS — R569 Unspecified convulsions: Secondary | ICD-10-CM | POA: Insufficient documentation

## 2017-01-23 DIAGNOSIS — Z79899 Other long term (current) drug therapy: Secondary | ICD-10-CM | POA: Insufficient documentation

## 2017-01-23 DIAGNOSIS — R51 Headache: Secondary | ICD-10-CM | POA: Insufficient documentation

## 2017-01-23 NOTE — ED Notes (Signed)
Pt verbalized understanding of discharge instructions. NAD at this time. 

## 2017-01-23 NOTE — ED Provider Notes (Signed)
[facility] Emergency Department Provider Note   ____________________________________________    I have reviewed the triage vital signs and the nursing notes.   HISTORY  Chief Complaint Headache and Seizures     HPI Peter Pennerdam D Olvera Jr. is a 21 y.o. male who presents after possible seizure. Patient was apparently lying in bed and reports all of a sudden he was on the opposite side of the bed and is unclear how this happened. Afterwards he had a mild frontal headache and some low back pain which he reports is typical for his postictal state. He reports mild soreness in the left side of his tongue. He reports compliance with his medications, he takes 1000 g of Keppra in the morning and 1500 at night. Has not seen a neurologist in several years. He feels well currently and has no complaints. No fevers or chills. No nausea or vomiting   Past Medical History:  Diagnosis Date  . Seizures (HCC)     There are no active problems to display for this patient.   History reviewed. No pertinent surgical history.  Prior to Admission medications   Medication Sig Start Date End Date Taking? Authorizing Provider  levETIRAcetam (KEPPRA) 500 MG tablet Take 1 tablet (500 mg total) by mouth once. Take one tablet with your 1000 mg tablets at night for seizure prevention.   You will be taking 1000 mg in the morning and 1500 mg at night Patient taking differently: Take 1,000-1,500 mg by mouth once. Take 1000 mg in the morning and 1500 mg at night. 09/21/15  Yes Emily FilbertWilliams, Jonathan E, MD  ibuprofen (ADVIL,MOTRIN) 800 MG tablet Take 1 tablet (800 mg total) by mouth every 8 (eight) hours as needed. Patient not taking: Reported on 01/23/2017 04/11/16   Evon SlackGaines, Thomas C, PA-C  penicillin v potassium (VEETID) 500 MG tablet Take 1 tablet (500 mg total) by mouth 3 (three) times daily. Patient not taking: Reported on 01/23/2017 11/15/16   Menshew, Charlesetta IvoryJenise V Bacon, PA-C      Allergies Patient has no known allergies.  History reviewed. No pertinent family history.  Social History Social History  Substance Use Topics  . Smoking status: Never Smoker  . Smokeless tobacco: Never Used  . Alcohol use No    Review of Systems  Constitutional: No fever/chills Eyes: No visual changes.  ENT: No sore throat.Tongue abrasion Cardiovascular: Denies chest pain. Respiratory: Denies shortness of breath. Gastrointestinal: No abdominal pain.  No nausea, no vomiting.   Genitourinary: Negative for dysuria. Musculoskeletal: As above Skin: Negative for rash. Neurological: Negative for weakness   ____________________________________________   PHYSICAL EXAM:  VITAL SIGNS: ED Triage Vitals  Enc Vitals Group     BP 01/23/17 1005 (!) 150/89     Pulse Rate 01/23/17 1003 80     Resp 01/23/17 1005 (!) 22     Temp 01/23/17 1005 98.1 F (36.7 C)     Temp Source 01/23/17 1005 Oral     SpO2 01/23/17 1003 99 %     Weight 01/23/17 1004 72.6 kg (160 lb)     Height 01/23/17 1004 1.753 m (5\' 9" )     Head Circumference --      Peak Flow --      Pain Score 01/23/17 1002 8     Pain Loc --      Pain Edu? --      Excl. in GC? --     Constitutional: Alert and oriented. No acute distress. Pleasant  and interactive Eyes: Conjunctivae are normal.  Head: Atraumatic. Nose: No congestion/rhinnorhea. Mouth/Throat: Mucous membranes are moist.  Abrasion left side of  anterior tongue Neck:  Painless ROM no vertebral tenderness to palpation, c-collar removed Cardiovascular: Normal rate, regular rhythm. Grossly normal heart sounds.  Good peripheral circulation. Respiratory: Normal respiratory effort.  No retractions. Lungs CTAB. Gastrointestinal: Soft and nontender. No distention.  No CVA tenderness. Genitourinary: deferred Musculoskeletal:  Warm and well perfused. Back: No vertebral tenderness to palpation, full range of motion without discomfort. Neurologic:  Normal speech and  language. No gross focal neurologic deficits are appreciated.  Skin:  Skin is warm, dry and intact. No rash noted. Psychiatric: Mood and affect are normal. Speech and behavior are normal.  ____________________________________________   LABS (all labs ordered are listed, but only abnormal results are displayed)  Labs Reviewed - No data to display ____________________________________________  EKG  None ____________________________________________  RADIOLOGY  None ____________________________________________   PROCEDURES  Procedure(s) performed: No    Critical Care performed: No ____________________________________________   INITIAL IMPRESSION / ASSESSMENT AND PLAN / ED COURSE  Pertinent labs & imaging results that were available during my care of the patient were reviewed by me and considered in my medical decision making (see chart for details).  Patient well-appearing in no acute distress. Reports compliance with medications. Do suspect that he has a seizure. He reports last seizure was in 2 months. Will discuss with neurology  Discussed with Dr. Thad Ranger of neurology, recommends increasing morning Keppra dose to 1250 mg. Patient observed in ED with no further seizure activity. Discussed plan with family and patient and agree    ____________________________________________   FINAL CLINICAL IMPRESSION(S) / ED DIAGNOSES  Final diagnoses:  Seizure (HCC)      NEW MEDICATIONS STARTED DURING THIS VISIT:  Discharge Medication List as of 01/23/2017 12:48 PM       Note:  This document was prepared using Dragon voice recognition software and may include unintentional dictation errors.    Jene Every, MD 01/23/17 [phone]

## 2017-01-23 NOTE — ED Triage Notes (Signed)
Pt to ED with c/o of headache. Pt states he feels as if he might have had a seizure during sleep. Pt states he feels as if he bite his tongue and has a headache that radiates down his neck and has tenderness along his spine. Pt has hx of seizures.

## 2017-01-23 NOTE — Discharge Instructions (Signed)
Please start taking 1250 mg of Keppra in the morning and continue taking 1500 mg of keppra in the evening. Please follow up with neurology.

## 2017-03-31 ENCOUNTER — Encounter: Payer: Self-pay | Admitting: Emergency Medicine

## 2017-03-31 ENCOUNTER — Emergency Department
Admission: EM | Admit: 2017-03-31 | Discharge: 2017-03-31 | Disposition: A | Discharge status: Home / Self Care | Payer: Self-pay | Attending: Emergency Medicine | Admitting: Emergency Medicine

## 2017-03-31 ENCOUNTER — Emergency Department: Payer: Self-pay

## 2017-03-31 DIAGNOSIS — Z79899 Other long term (current) drug therapy: Secondary | ICD-10-CM | POA: Insufficient documentation

## 2017-03-31 DIAGNOSIS — M940 Chondrocostal junction syndrome [Tietze]: Secondary | ICD-10-CM | POA: Insufficient documentation

## 2017-03-31 LAB — CBC
HEMATOCRIT: 46.2 % (ref 40.0–52.0)
Hemoglobin: 16 g/dL (ref 13.0–18.0)
MCH: 30.2 pg (ref 26.0–34.0)
MCHC: 34.6 g/dL (ref 32.0–36.0)
MCV: 87.1 fL (ref 80.0–100.0)
Platelets: 173 10*3/uL (ref 150–440)
RBC: 5.31 MIL/uL (ref 4.40–5.90)
RDW: 13.3 % (ref 11.5–14.5)
WBC: 4.9 10*3/uL (ref 3.8–10.6)

## 2017-03-31 LAB — COMPREHENSIVE METABOLIC PANEL
ALK PHOS: 74 U/L (ref 38–126)
ALT: 15 U/L — ABNORMAL LOW (ref 17–63)
ANION GAP: 5 (ref 5–15)
AST: 22 U/L (ref 15–41)
Albumin: 4.1 g/dL (ref 3.5–5.0)
BUN: 16 mg/dL (ref 6–20)
CALCIUM: 8.9 mg/dL (ref 8.9–10.3)
CO2: 24 mmol/L (ref 22–32)
Chloride: 107 mmol/L (ref 101–111)
Creatinine, Ser: 0.93 mg/dL (ref 0.61–1.24)
GFR calc non Af Amer: >60 mL/min (ref >60)
Glucose, Bld: 95 mg/dL (ref 65–99)
Potassium: 3.5 mmol/L (ref 3.5–5.1)
SODIUM: 136 mmol/L (ref 135–145)
TOTAL PROTEIN: 7.2 g/dL (ref 6.5–8.1)
Total Bilirubin: 1 mg/dL (ref 0.3–1.2)

## 2017-03-31 LAB — FIBRIN DERIVATIVES D-DIMER (ARMC ONLY): Fibrin derivatives D-dimer (ARMC): 70.15 (ref 0.00–499.00)

## 2017-03-31 LAB — TROPONIN I: Troponin I: <0.03 ng/mL (ref <0.03)

## 2017-03-31 MED ORDER — NAPROXEN 500 MG PO TABS: 500.0000 mg | ORAL_TABLET | Freq: Two times a day (BID) | ORAL | 2 refills | Status: AC

## 2017-03-31 MED ORDER — KETOROLAC TROMETHAMINE 30 MG/ML IJ SOLN
30.0000 mg | Freq: Once | INTRAMUSCULAR | Status: AC
  Administered 2017-03-31: 30 mg via INTRAVENOUS
  Filled 2017-03-31: qty 1

## 2017-03-31 NOTE — ED Triage Notes (Signed)
Pt presents to ED via [facility] from mid-sternal chest pain starting 0700 and mild shortness of breath. Only history is seizures. VSS per EMS.

## 2017-03-31 NOTE — ED Provider Notes (Signed)
[facility] Emergency Department Provider Note   ____________________________________________    I have reviewed the triage vital signs and the nursing notes.   HISTORY  Chief Complaint Chest Pain     HPI Peter Pennerdam D Viscuso Jr. is a 21 y.o. male who presents with complaints of chest pain and shortness of breath. Patient reports he has had anterior chest wall discomfort for approximately 2 weeks. This morning he reports he felt that he was having difficulty breathing because it was painful to take a deep breath.Denies cough fever or chills. No history of heart disease. States he does not smoke, denies drug use. No injury to the area. He has not taken anything for this. He has a history of seizures and takes Keppra. No nausea or vomiting or diaphoresis. Pain does not radiate. Chest wall pain is centered around the lower sternum   Past Medical History:  Diagnosis Date  . Seizures (HCC)     There are no active problems to display for this patient.   History reviewed. No pertinent surgical history.  Prior to Admission medications   Medication Sig Start Date End Date Taking? Authorizing Provider  levETIRAcetam (KEPPRA) 500 MG tablet Take 1 tablet (500 mg total) by mouth once. Take one tablet with your 1000 mg tablets at night for seizure prevention.   You will be taking 1000 mg in the morning and 1500 mg at night Patient taking differently: Take 1,000-1,500 mg by mouth 2 (two) times daily. Take 1000 mg in the morning and 1500 mg at night. 09/21/15  Yes Emily FilbertWilliams, Jonathan E, MD  ibuprofen (ADVIL,MOTRIN) 800 MG tablet Take 1 tablet (800 mg total) by mouth every 8 (eight) hours as needed. Patient not taking: Reported on 01/23/2017 04/11/16   Evon SlackGaines, Thomas C, PA-C  naproxen (NAPROSYN) 500 MG tablet Take 1 tablet (500 mg total) by mouth 2 (two) times daily with a meal. 03/31/17   Jene EveryKinner, Keygan Dumond, MD  penicillin v potassium (VEETID) 500 MG tablet Take 1 tablet (500 mg  total) by mouth 3 (three) times daily. Patient not taking: Reported on 01/23/2017 11/15/16   Menshew, Charlesetta IvoryJenise V Bacon, PA-C     Allergies Patient has no known allergies.  History reviewed. No pertinent family history.  Social History Social History  Substance Use Topics  . Smoking status: Never Smoker  . Smokeless tobacco: Never Used  . Alcohol use No    Review of Systems  Constitutional: No fever/chills Eyes: No visual changes.  ENT: No sore throat. Cardiovascular: As above Respiratory: As above Gastrointestinal: No abdominal pain.  No nausea, no vomiting.   Genitourinary: Negative for dysuria. Musculoskeletal: Negative for back pain. Skin: Negative for rash. Neurological: Negative for headaches    ____________________________________________   PHYSICAL EXAM:  VITAL SIGNS: ED Triage Vitals  Enc Vitals Group     BP 03/31/17 0850 (!) 143/80     Pulse Rate 03/31/17 0850 68     Resp 03/31/17 0850 13     Temp 03/31/17 0850 98.2 F (36.8 C)     Temp Source 03/31/17 0850 Oral     SpO2 03/31/17 0850 100 %     Weight 03/31/17 0847 76.2 kg (168 lb)     Height 03/31/17 0847 1.778 m (5\' 10" )     Head Circumference --      Peak Flow --      Pain Score 03/31/17 0847 9     Pain Loc --      Pain Edu? --  Excl. in GC? --     Constitutional: Alert and oriented. No acute distress. Pleasant and interactive Eyes: Conjunctivae are normal.  Head: Atraumatic. Nose: No congestion/rhinnorhea. Mouth/Throat: Mucous membranes are moist.   Neck:  Painless ROM Cardiovascular: Normal rate, regular rhythm. Grossly normal heart sounds.  Good peripheral circulation.Mild tenderness to palpation along the borders of the inferior sternum. No bony abnormalities. No erythema or rash. Respiratory: Normal respiratory effort.  No retractions. Lungs CTAB. Gastrointestinal: Soft and nontender. No distention.  No CVA tenderness. Genitourinary: deferred Musculoskeletal: No lower extremity  tenderness nor edema.  Warm and well perfused Neurologic:  Normal speech and language. No gross focal neurologic deficits are appreciated.  Skin:  Skin is warm, dry and intact. No rash noted. Psychiatric: Mood and affect are normal. Speech and behavior are normal.  ____________________________________________   LABS (all labs ordered are listed, but only abnormal results are displayed)  Labs Reviewed  COMPREHENSIVE METABOLIC PANEL - Abnormal; Notable for the following:       Result Value   ALT 15 (*)    All other components within normal limits  CBC  TROPONIN I  FIBRIN DERIVATIVES D-DIMER ([facility] ONLY)   ____________________________________________  EKG  ED ECG REPORT I, Jene Every, the attending physician, personally viewed and interpreted this ECG.  Date: 03/31/2017  Rate: 71 Rhythm: normal sinus rhythm QRS Axis: normal Intervals: normal ST/T Wave abnormalities: normal Narrative Interpretation: no evidence of acute ischemia  ____________________________________________  RADIOLOGY  Chest x-ray unremarkable ____________________________________________   PROCEDURES  Procedure(s) performed: No    Critical Care performed: No ____________________________________________   INITIAL IMPRESSION / ASSESSMENT AND PLAN / ED COURSE  Pertinent labs & imaging results that were available during my care of the patient were reviewed by me and considered in my medical decision making (see chart for details).  Patient overall well-appearing, AVSS, exam does not concern some chest wall pain. We will check labs, including d-dimer, chest x-ray and monitor the patient.  Lab work unremarkable, chest x-ray benign. Normal d-dimer. Patient given IV Toradol and had complete resolution of his pain. I suspect costochondritis, we'll treat him with NSAIDs and have him follow-up with his PCP. Return precautions discussed at length.     ____________________________________________   FINAL CLINICAL IMPRESSION(S) / ED DIAGNOSES  Final diagnoses:  Costochondritis      NEW MEDICATIONS STARTED DURING THIS VISIT:  New Prescriptions   NAPROXEN (NAPROSYN) 500 MG TABLET    Take 1 tablet (500 mg total) by mouth 2 (two) times daily with a meal.     Note:  This document was prepared using Dragon voice recognition software and may include unintentional dictation errors.    Jene Every, MD 03/31/17 1227

## 2018-01-12 ENCOUNTER — Other Ambulatory Visit: Payer: Self-pay

## 2018-01-12 ENCOUNTER — Emergency Department
Admission: EM | Admit: 2018-01-12 | Discharge: 2018-01-12 | Disposition: A | Discharge status: Home / Self Care | Payer: Self-pay | Attending: Emergency Medicine | Admitting: Emergency Medicine

## 2018-01-12 ENCOUNTER — Encounter: Payer: Self-pay | Admitting: Emergency Medicine

## 2018-01-12 ENCOUNTER — Emergency Department: Payer: Self-pay

## 2018-01-12 DIAGNOSIS — Z79899 Other long term (current) drug therapy: Secondary | ICD-10-CM | POA: Insufficient documentation

## 2018-01-12 DIAGNOSIS — M940 Chondrocostal junction syndrome [Tietze]: Secondary | ICD-10-CM | POA: Insufficient documentation

## 2018-01-12 MED ORDER — IBUPROFEN 600 MG PO TABS
600.0000 mg | ORAL_TABLET | ORAL | Status: AC
  Administered 2018-01-12: 600 mg via ORAL
  Filled 2018-01-12: qty 1

## 2018-01-12 NOTE — Discharge Instructions (Addendum)
Follow-up with your Doctor at [facility].  Return to the Emergency Department (ED) if you experience any further chest pain/pressure/tightness, difficulty breathing, or sudden sweating, or other symptoms that concern you.

## 2018-01-12 NOTE — ED Provider Notes (Signed)
[facility] Emergency Department Provider Note   ____________________________________________   First MD Initiated Contact with Patient 01/12/18 1052     (approximate)  I have reviewed the triage vital signs and the nursing notes.   HISTORY  Chief Complaint Chest Pain    HPI Peter Pennerdam D Boettner Jr. is a 22 y.o. male been having chest discomfort for about 1 week, off and on worse when he is at work and noticed bubble when he has to do heavy lifting.  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  No pain or discomfort with normal walking and exertion, reports symptoms most notable when he is at work lifting things.  He was at work today and came because the pain started once again after lifting an ice cream bucket  No numbness or tingling.  No fevers or chills.  Described as a mild to moderate sharp feeling across the breastbone especially worsened with picking things up.  No shortness of breath, except he did feel slightly short of breath when the pain came earlier but is now gone away.  No history of major trauma, no recent hospitalizations.  No leg swelling.  No history of any blood clots.  Takes no medications.  Has not taken any Tylenol or ibuprofen.  Reports he had the same symptoms off and on about 3 or 4 times over the last few years, was seen in the ER about 6 months ago for the same   Past Medical History:  Diagnosis Date  . Seizures (HCC)     There are no active problems to display for this patient.   History reviewed. No pertinent surgical history.  Prior to Admission medications   Medication Sig Start Date End Date Taking? Authorizing Provider  ibuprofen (ADVIL,MOTRIN) 800 MG tablet Take 1 tablet (800 mg total) by mouth every 8 (eight) hours as needed. Patient not taking: Reported on 01/23/2017 04/11/16   Evon SlackGaines, Thomas C, PA-C  levETIRAcetam (KEPPRA) 500 MG  tablet Take 1 tablet (500 mg total) by mouth once. Take one tablet with your 1000 mg tablets at night for seizure prevention.   You will be taking 1000 mg in the morning and 1500 mg at night Patient taking differently: Take 1,000-1,500 mg by mouth 2 (two) times daily. Take 1000 mg in the morning and 1500 mg at night. 09/21/15   Emily FilbertWilliams, Jonathan E, MD  naproxen (NAPROSYN) 500 MG tablet Take 1 tablet (500 mg total) by mouth 2 (two) times daily with a meal. 03/31/17   Jene EveryKinner, Robert, MD  penicillin v potassium (VEETID) 500 MG tablet Take 1 tablet (500 mg total) by mouth 3 (three) times daily. Patient not taking: Reported on 01/23/2017 11/15/16   Menshew, Charlesetta IvoryJenise V Bacon, PA-C    Allergies Patient has no known allergies.  No family history on file.  Social History Social History   Tobacco Use  . Smoking status: Never Smoker  . Smokeless tobacco: Never Used  Substance Use Topics  . Alcohol use: No  . Drug use: No    Review of Systems Constitutional: No fever/chills Eyes: No visual changes. ENT: No sore throat. Cardiovascular: See HPI Respiratory: See HPI .  No cough.  No blood in sputum. gastrointestinal: No abdominal pain.  No nausea, no vomiting.  No diarrhea.  No constipation. Genitourinary: Negative for dysuria. Musculoskeletal: Negative for back pain. Skin: Negative for rash. Neurological:  Negative for headaches, focal weakness or numbness.    ____________________________________________   PHYSICAL EXAM:  VITAL SIGNS: ED Triage Vitals  Enc Vitals Group     BP 01/12/18 0943 127/67     Pulse Rate 01/12/18 0943 77     Resp 01/12/18 0943 18     Temp 01/12/18 0943 98.2 F (36.8 C)     Temp Source 01/12/18 0943 Oral     SpO2 01/12/18 0943 98 %     Weight 01/12/18 0936 170 lb (77.1 kg)     Height 01/12/18 1050 5\' 7"  (1.702 m)     Head Circumference --      Peak Flow --      Pain Score 01/12/18 0936 8     Pain Loc --      Pain Edu? --      Excl. in GC? --      Constitutional: Alert and oriented. Well appearing and in no acute distress. Eyes: Conjunctivae are normal. Head: Atraumatic. Nose: No congestion/rhinnorhea. Mouth/Throat: Mucous membranes are moist. Neck: No stridor.   Cardiovascular: Normal rate, regular rhythm. Grossly normal heart sounds.  Good peripheral circulation. Respiratory: Normal respiratory effort.  No retractions. Lungs CTAB. Gastrointestinal: Soft and nontender.  Musculoskeletal: No lower extremity tenderness nor edema. Neurologic:  Normal speech and language. No gross focal neurologic deficits are appreciated.  Skin:  Skin is warm, dry and intact. No rash noted. Psychiatric: Mood and affect are normal. Speech and behavior are normal.  ____________________________________________   LABS (all labs ordered are listed, but only abnormal results are displayed)  Labs Reviewed - No data to display ____________________________________________  EKG  EKG reviewed enterotomy at 9:30 AM Heart rate 65 QRS 85 QTc 400 Normal sinus rhythm, no evidence of ischemia or ectopy. ____________________________________________  RADIOLOGY  Chest x-ray reviewed, normal ____________________________________________   PROCEDURES  Procedure(s) performed: None  Procedures  Critical Care performed: No  ____________________________________________   INITIAL IMPRESSION / ASSESSMENT AND PLAN / ED COURSE  Pertinent labs & imaging results that were available during my care of the patient were reviewed by me and considered in my medical decision making (see chart for details).  Differential diagnosis includes, but is not limited to, ACS, aortic dissection, pulmonary embolism, cardiac tamponade, pneumothorax, pneumonia, pericarditis, myocarditis, GI-related causes including esophagitis/gastritis, and musculoskeletal chest wall pain.  EKG reassuring, PERC negative.  History very low suspicion for ACS pulmonary embolism, dissection  or other acute major process.  No evidence to support pericarditis or  myocarditis.     Pulmonary Embolism Rule-out Criteria (PERC rule)                        If YES to ANY of the following, the PERC rule is not satisfied and cannot be used to rule out PE in this patient (consider d-dimer or imaging depending on pre-test probability).                      If NO to ALL of the following, AND the clinician's pre-test probability is <15%, the [facility] rule is satisfied and there is no need for further workup (including no need to obtain a d-dimer) as the post-test probability of pulmonary embolism is <2%.                      Mnemonic is HAD CLOTS   H - hormone use (exogenous estrogen)      No. A -  age > 50                                                 No. D - DVT/PE history                                      No.   C - coughing blood (hemoptysis)                 No. L - leg swelling, unilateral                             No. O - O2 Sat on Room Air < 95%                  No. T - tachycardia (HR ? 100)                         No. S - surgery or trauma, recent                      No.   Based on my evaluation of the patient, including application of this decision instrument, further testing to evaluate for pulmonary embolism is not indicated at this time.   ----------------------------------------- 11:29 AM on 01/12/2018 -----------------------------------------  Discussed with patient, recommended treatment with over-the-counter ibuprofen which she is agreeable to.  Return precautions and follow-up with primary care doctor discussed.  Patient agreeable.        ____________________________________________   FINAL CLINICAL IMPRESSION(S) / ED DIAGNOSES  Final diagnoses:  Acute costochondritis      NEW MEDICATIONS STARTED DURING THIS VISIT:  New Prescriptions   No medications on file     Note:  This document was prepared using Dragon voice recognition software and may  include unintentional dictation errors.     Sharyn Creamer, MD 01/12/18 [phone]

## 2018-01-12 NOTE — ED Triage Notes (Signed)
Says mid chest sharp pain since 5am. Increase with breathing.

## 2018-07-01 IMAGING — DX DG CHEST 1V PORT
1 series · 1 of 1 positions shown · non-contrast
Comparison: None.

CLINICAL DATA: 20-year-old male with left-sided chest pain and
numbness with headache. Nonsmoker. Initial encounter.

EXAM:
PORTABLE CHEST 1 VIEW

[chest ap]
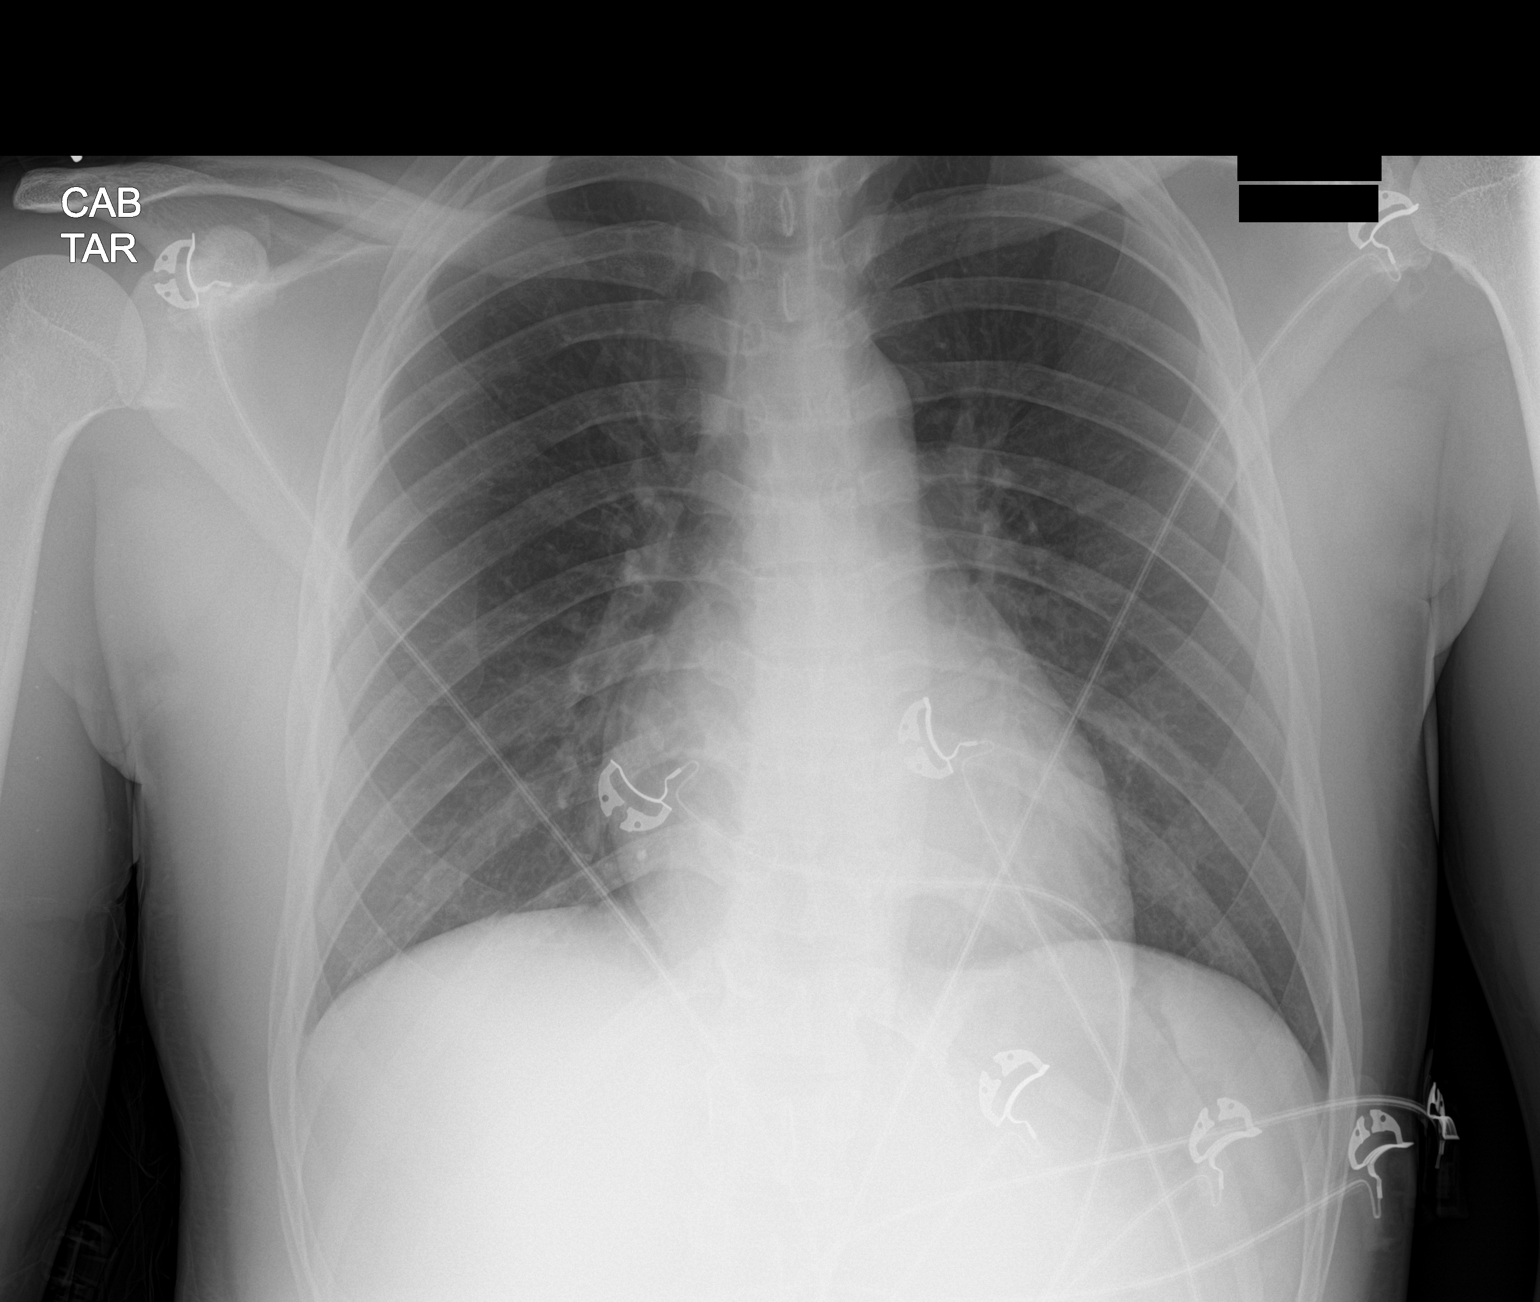

[1 of 1 positions shown; findings below may reference images not displayed]

FINDINGS: No infiltrate, congestive heart failure or pneumothorax.

Mild scoliosis thoracic spine.

Heart size within normal limits.
IMPRESSION: No active disease.

## 2019-02-03 ENCOUNTER — Other Ambulatory Visit: Payer: Self-pay

## 2019-02-03 ENCOUNTER — Encounter: Payer: Self-pay | Admitting: Emergency Medicine

## 2019-02-03 ENCOUNTER — Emergency Department
Admission: EM | Admit: 2019-02-03 | Discharge: 2019-02-03 | Disposition: A | Discharge status: Home / Self Care | Payer: Self-pay | Attending: Emergency Medicine | Admitting: Emergency Medicine

## 2019-02-03 ENCOUNTER — Emergency Department: Payer: Self-pay

## 2019-02-03 DIAGNOSIS — R002 Palpitations: Secondary | ICD-10-CM | POA: Insufficient documentation

## 2019-02-03 DIAGNOSIS — Z79899 Other long term (current) drug therapy: Secondary | ICD-10-CM | POA: Insufficient documentation

## 2019-02-03 LAB — URINALYSIS, COMPLETE (UACMP) WITH MICROSCOPIC
Bacteria, UA: NONE SEEN
Bilirubin Urine: NEGATIVE
Glucose, UA: NEGATIVE mg/dL
Hgb urine dipstick: NEGATIVE
Ketones, ur: NEGATIVE mg/dL
Nitrite: NEGATIVE
Protein, ur: 30 mg/dL — AB
Specific Gravity, Urine: 1.031 — ABNORMAL HIGH (ref 1.005–1.030)
pH: 5 (ref 5.0–8.0)

## 2019-02-03 LAB — CBC
HCT: 47.4 % (ref 39.0–52.0)
Hemoglobin: 16.3 g/dL (ref 13.0–17.0)
MCH: 29.9 pg (ref 26.0–34.0)
MCHC: 34.4 g/dL (ref 30.0–36.0)
MCV: 87 fL (ref 80.0–100.0)
Platelets: 203 10*3/uL (ref 150–400)
RBC: 5.45 MIL/uL (ref 4.22–5.81)
RDW: 13.2 % (ref 11.5–15.5)
WBC: 7.4 10*3/uL (ref 4.0–10.5)
nRBC: 0 % (ref 0.0–0.2)

## 2019-02-03 LAB — BASIC METABOLIC PANEL
Anion gap: 7 (ref 5–15)
BUN: 21 mg/dL — ABNORMAL HIGH (ref 6–20)
CO2: 22 mmol/L (ref 22–32)
Calcium: 9.4 mg/dL (ref 8.9–10.3)
Chloride: 109 mmol/L (ref 98–111)
Creatinine, Ser: 0.93 mg/dL (ref 0.61–1.24)
GFR calc Af Amer: >60 mL/min (ref >60)
GFR calc non Af Amer: >60 mL/min (ref >60)
Glucose, Bld: 133 mg/dL — ABNORMAL HIGH (ref 70–99)
Potassium: 3.3 mmol/L — ABNORMAL LOW (ref 3.5–5.1)
Sodium: 138 mmol/L (ref 135–145)

## 2019-02-03 NOTE — ED Provider Notes (Signed)
[facility] Emergency Department Provider Note ____________________________________________   First MD Initiated Contact with Patient 02/03/19 2151     (approximate)  I have reviewed the triage vital signs and the nursing notes.   HISTORY  Chief Complaint Weakness    HPI Peter Pennerdam D Stratton Jr. is a 23 y.o. male with history of seizure disorder on Keppra who presents with palpitations, acute onset around 1 in the afternoon today while he was resting at home.  He reported feeling like his mouth was very dry and felt somewhat lightheaded.  He denies having chest pain or shortness of breath.  He states he got up and walked around for a while but did not feel better so he came to the hospital.  He was waiting for several hours in the waiting room before being seen, and states that the symptoms resolved at some point while he was waiting.  He denies any prior history of this symptom.  He states his last seizure was several years ago and was a generalized seizure in which he lost consciousness.  He denies any heavy alcohol or drug use and has been compliant with his Keppra.   Past Medical History:  Diagnosis Date  . Seizures (HCC)     There are no active problems to display for this patient.   History reviewed. No pertinent surgical history.  Prior to Admission medications   Medication Sig Start Date End Date Taking? Authorizing Provider  ibuprofen (ADVIL,MOTRIN) 800 MG tablet Take 1 tablet (800 mg total) by mouth every 8 (eight) hours as needed. Patient not taking: Reported on 01/23/2017 04/11/16   Evon SlackGaines, Thomas C, PA-C  levETIRAcetam (KEPPRA) 500 MG tablet Take 1 tablet (500 mg total) by mouth once. Take one tablet with your 1000 mg tablets at night for seizure prevention.   You will be taking 1000 mg in the morning and 1500 mg at night Patient taking differently: Take 1,000-1,500 mg by mouth 2 (two) times daily. Take 1000 mg in the morning and 1500 mg at night.  09/21/15   Emily FilbertWilliams, Jonathan E, MD  naproxen (NAPROSYN) 500 MG tablet Take 1 tablet (500 mg total) by mouth 2 (two) times daily with a meal. 03/31/17   Jene EveryKinner, Robert, MD  penicillin v potassium (VEETID) 500 MG tablet Take 1 tablet (500 mg total) by mouth 3 (three) times daily. Patient not taking: Reported on 01/23/2017 11/15/16   Menshew, Charlesetta IvoryJenise V Bacon, PA-C    Allergies Patient has no known allergies.  No family history on file.  Social History Social History   Tobacco Use  . Smoking status: Never Smoker  . Smokeless tobacco: Never Used  Substance Use Topics  . Alcohol use: No  . Drug use: No    Review of Systems  Constitutional: No fever/chills Eyes: No visual changes. ENT: No sore throat. Cardiovascular: Positive for resolved palpitations. Respiratory: Denies shortness of breath. Gastrointestinal: No vomiting or diarrhea.  Genitourinary: Negative for dysuria.  Musculoskeletal: Negative for back pain. Skin: Negative for rash. Neurological: Negative for headache.   ____________________________________________   PHYSICAL EXAM:  VITAL SIGNS: ED Triage Vitals  Enc Vitals Group     BP 02/03/19 1547 123/78     Pulse Rate 02/03/19 1547 (!) 114     Resp 02/03/19 1547 16     Temp 02/03/19 1547 98.7 F (37.1 C)     Temp Source 02/03/19 1547 Oral     SpO2 02/03/19 1547 97 %     Weight 02/03/19  1548 185 lb (83.9 kg)     Height 02/03/19 1548 5\' 7"  (1.702 m)     Head Circumference --      Peak Flow --      Pain Score 02/03/19 1555 8     Pain Loc --      Pain Edu? --      Excl. in [place]? --     Constitutional: Alert and oriented. Well appearing and in no acute distress. Eyes: Conjunctivae are normal.  Head: Atraumatic. Nose: No congestion/rhinnorhea. Mouth/Throat: Mucous membranes are moist.   Neck: Normal range of motion.  Cardiovascular: Normal rate, regular rhythm. Good peripheral circulation. Respiratory: Normal respiratory effort.  No retractions.  Gastrointestinal: No distention.  Musculoskeletal: Extremities warm and well perfused.  Neurologic:  Normal speech and language. No gross focal neurologic deficits are appreciated.  Skin:  Skin is warm and dry. No rash noted. Psychiatric: Mood and affect are normal. Speech and behavior are normal.  ____________________________________________   LABS (all labs ordered are listed, but only abnormal results are displayed)  Labs Reviewed  BASIC METABOLIC PANEL - Abnormal; Notable for the following components:      Result Value   Potassium 3.3 (*)    Glucose, Bld 133 (*)    BUN 21 (*)    All other components within normal limits  URINALYSIS, COMPLETE (UACMP) WITH MICROSCOPIC - Abnormal; Notable for the following components:   Color, Urine YELLOW (*)    APPearance HAZY (*)    Specific Gravity, Urine 1.031 (*)    Protein, ur 30 (*)    Leukocytes,Ua TRACE (*)    All other components within normal limits  CBC   ____________________________________________  EKG  ED ECG REPORT I, Arta Silence, the attending physician, personally viewed and interpreted this ECG.  Date: 02/03/2019 EKG Time: 1543 Rate: 112 Rhythm: Sinus tachycardia QRS Axis: normal Intervals: normal ST/T Wave abnormalities: normal Narrative Interpretation: no evidence of acute ischemia  ____________________________________________  RADIOLOGY  CXR: No acute abnormality  ____________________________________________   PROCEDURES  Procedure(s) performed: No  Procedures  Critical Care performed: No ____________________________________________   INITIAL IMPRESSION / ASSESSMENT AND PLAN / ED COURSE  Pertinent labs & imaging results that were available during my care of the patient were reviewed by me and considered in my medical decision making (see chart for details).  23 year old male with a history of seizure disorder on Keppra with most recent seizure several years ago presents with an  episode of palpitations that lasted for a few hours but resolved prior to being seen by me.  He states he feels slightly tired and drained afterwards but denies any acute symptoms at this time.  On exam, the patient is well-appearing.  His vital signs are now normal although he was slightly tachycardic on arrival.  The remainder of the exam is unremarkable.  His EKG shows no acute abnormalities other than sinus tachycardia when he arrived.  Overall presentation is most consistent with benign etiology such as anxiety or mild dehydration.  I have a lower suspicion for cardiac etiology given that there are no notable EKG findings.  There is no evidence for seizure.  Lab work-up was obtained and is unremarkable.  At this time, the patient is stable for discharge home.  I counseled him on the results of the work-up.  If this happens again, he likely will need a Holter monitor.  I advised him on the importance of follow-up.  Return precautions given, and he expressed understanding.  ____________________________________________  FINAL CLINICAL IMPRESSION(S) / ED DIAGNOSES  Final diagnoses:  Palpitations      NEW MEDICATIONS STARTED DURING THIS VISIT:  Discharge Medication List as of 02/03/2019 10:15 PM       Note:  This document was prepared using Dragon voice recognition software and may include unintentional dictation errors.   Dionne BucySiadecki, Dynastee Brummell, MD 02/03/19 2311

## 2019-02-03 NOTE — Discharge Instructions (Signed)
Return to the ER for new, worsening, or persistent feeling of your heart racing or fluttering, lightheadedness, chest pain, shortness of breath, or any other new or worsening symptoms that concern you.  Follow-up with your regular doctor in the next 1 to 2 weeks.

## 2019-02-03 NOTE — ED Notes (Signed)
Reports palpitations denies cp/sob. States nevr had an episode like this before. palpitations subsided during  Wait to be seen in ed.

## 2019-02-03 NOTE — ED Triage Notes (Signed)
First Nurse Note:  Arrives via EMS to get checked out.  States had gone out for a walk today and possibly had a 'silent seizure'.  No LOC.  Admits to using drugs today and yesterday -- Marijuana.  VS wnl.

## 2019-02-03 NOTE — ED Triage Notes (Signed)
Patient presents to the ED via EMS from home.  Patient states, "I was sitting on the couch today and around 2:15 I suddenly felt different."  Patient reports feeling palpitations and feeling weak.  Patient states, "My body was tightening up."  Patient continues to have palpitations and feel weak, "drained."
